# Patient Record
Sex: Female | Born: 1935 | Race: Black or African American | Hispanic: No
Health system: Southern US, Community
[De-identification: ages and names within clinical notes are randomized; demographics above are authoritative.]

---

## 2005-08-18 ENCOUNTER — Ambulatory Visit: Payer: Self-pay | Admitting: Family Medicine

## 2006-09-07 ENCOUNTER — Ambulatory Visit: Payer: Self-pay | Admitting: Family Medicine

## 2007-09-20 ENCOUNTER — Ambulatory Visit: Payer: Self-pay | Admitting: Family Medicine

## 2008-04-03 ENCOUNTER — Ambulatory Visit: Payer: Self-pay | Admitting: Unknown Physician Specialty

## 2008-04-03 ENCOUNTER — Other Ambulatory Visit: Payer: Self-pay

## 2008-04-11 ENCOUNTER — Ambulatory Visit: Payer: Self-pay | Admitting: Unknown Physician Specialty

## 2008-09-25 ENCOUNTER — Ambulatory Visit: Payer: Self-pay | Admitting: Family Medicine

## 2009-10-29 ENCOUNTER — Ambulatory Visit: Payer: Self-pay | Admitting: Family Medicine

## 2011-02-06 ENCOUNTER — Ambulatory Visit: Payer: Self-pay | Admitting: Family Medicine

## 2011-11-12 LAB — CBC
HCT: 28.6 % — ABNORMAL LOW (ref 35.0–47.0)
HGB: 9.5 g/dL — ABNORMAL LOW (ref 12.0–16.0)
MCH: 30 pg (ref 26.0–34.0)
MCHC: 33 g/dL (ref 32.0–36.0)
MCV: 91 fL (ref 80–100)

## 2011-11-12 LAB — TROPONIN I: Troponin-I: <0.02 ng/mL

## 2011-11-12 LAB — PROTIME-INR: Prothrombin Time: 14 secs (ref 11.5–14.7)

## 2011-11-12 LAB — COMPREHENSIVE METABOLIC PANEL
Albumin: 3.4 g/dL (ref 3.4–5.0)
Alkaline Phosphatase: 45 U/L — ABNORMAL LOW (ref 50–136)
Bilirubin,Total: 0.3 mg/dL (ref 0.2–1.0)
Osmolality: 305 (ref 275–301)
Potassium: 3.2 mmol/L — ABNORMAL LOW (ref 3.5–5.1)
SGPT (ALT): 17 U/L
Total Protein: 6.1 g/dL — ABNORMAL LOW (ref 6.4–8.2)

## 2011-11-12 LAB — APTT: Activated PTT: 24.6 secs (ref 23.6–35.9)

## 2011-11-13 ENCOUNTER — Inpatient Hospital Stay: Payer: Self-pay | Admitting: Internal Medicine

## 2011-11-13 LAB — BASIC METABOLIC PANEL
Anion Gap: 10 (ref 7–16)
Chloride: 111 mmol/L — ABNORMAL HIGH (ref 98–107)
Co2: 26 mmol/L (ref 21–32)
Osmolality: 301 (ref 275–301)

## 2011-11-13 LAB — CBC WITH DIFFERENTIAL/PLATELET
Basophil #: 0 10*3/uL (ref 0.0–0.1)
Basophil %: 0.5 %
HCT: 31.3 % — ABNORMAL LOW (ref 35.0–47.0)
HGB: 10.5 g/dL — ABNORMAL LOW (ref 12.0–16.0)
Lymphocyte #: 2.9 10*3/uL (ref 1.0–3.6)
Lymphocyte %: 33.2 %
MCH: 30 pg (ref 26.0–34.0)
MCHC: 33.6 g/dL (ref 32.0–36.0)
MCV: 89 fL (ref 80–100)
Neutrophil #: 5.2 10*3/uL (ref 1.4–6.5)
RDW: 14.3 % (ref 11.5–14.5)

## 2011-11-13 LAB — HEMATOCRIT: HCT: 29.2 % — ABNORMAL LOW (ref 35.0–47.0)

## 2011-11-14 LAB — LIPID PANEL
Cholesterol: 112 mg/dL (ref 0–200)
HDL Cholesterol: 25 mg/dL — ABNORMAL LOW (ref 40–60)
Triglycerides: 132 mg/dL (ref 0–200)
VLDL Cholesterol, Calc: 26 mg/dL (ref 5–40)

## 2011-11-14 LAB — CBC WITH DIFFERENTIAL/PLATELET
Basophil #: 0 10*3/uL (ref 0.0–0.1)
Basophil %: 0.4 %
Eosinophil %: 1.8 %
HCT: 27 % — ABNORMAL LOW (ref 35.0–47.0)
HGB: 9 g/dL — ABNORMAL LOW (ref 12.0–16.0)
Lymphocyte #: 2.5 10*3/uL (ref 1.0–3.6)
Lymphocyte %: 34.5 %
MCH: 29.8 pg (ref 26.0–34.0)
MCHC: 33.2 g/dL (ref 32.0–36.0)
MCV: 90 fL (ref 80–100)
Monocyte #: 0.3 10*3/uL (ref 0.0–0.7)
Monocyte %: 4 %
RDW: 14.2 % (ref 11.5–14.5)
WBC: 7.4 10*3/uL (ref 3.6–11.0)

## 2011-11-14 LAB — BASIC METABOLIC PANEL
Anion Gap: 10 (ref 7–16)
BUN: 23 mg/dL — ABNORMAL HIGH (ref 7–18)
Chloride: 114 mmol/L — ABNORMAL HIGH (ref 98–107)
Co2: 25 mmol/L (ref 21–32)
Osmolality: 300 (ref 275–301)
Potassium: 3.4 mmol/L — ABNORMAL LOW (ref 3.5–5.1)

## 2012-02-11 ENCOUNTER — Ambulatory Visit: Payer: Self-pay | Admitting: Family Medicine

## 2012-06-28 ENCOUNTER — Ambulatory Visit: Payer: Self-pay | Admitting: Gastroenterology

## 2013-04-07 ENCOUNTER — Ambulatory Visit: Payer: Self-pay | Admitting: Family Medicine

## 2014-05-30 ENCOUNTER — Ambulatory Visit: Payer: Self-pay | Admitting: Family Medicine

## 2015-02-04 NOTE — Consult Note (Signed)
Full consult to follow. Multiple bouts of hematemesis last night. Daily bASA plus few days of ibuprofen for bursitis. Hx of GU many yrs ago. Diverticulosis 2003. Hypotensive on admission. BP stable now. No abd pain. Abd nontender. NPO except meds since admission. Plan EGD this afternoon. THanks  Electronic Signatures: Lutricia Feilh, Zacory Fiola (MD)  (Signed on 31-Jan-13 14:50)  Authored  Last Updated: 31-Jan-13 14:50 by Lutricia Feilh, Sharmeka Palmisano (MD)

## 2015-02-04 NOTE — Discharge Summary (Signed)
PATIENT NAME:  [name], Wendy [name] MR#:  [id number] DATE OF BIRTH:  1934-07-04  DATE OF ADMISSION:  11/13/2011 DATE OF DISCHARGE:  11/14/2011  DISCHARGE DIAGNOSES:  1. Hematemesis secondary to erosion at the gastroesophageal junction or Mallory Weiss tear, status post cauterization.  2. Hypertension.   DISCHARGE FOLLOWUP: The patient will followup with her primary doctor, Dr. Illene RegulusSelvidge, in 1 to 2 weeks and also Dr. Bluford Kaufmannh in 1 to 2 weeks.  DISCHARGE MEDICATIONS:  1. Protonix 40 mg p.o. daily.  2. HCTZ/felodipine at home for blood pressure, but she does not remember the dose. She is to call us with the dose so she can continue the same dose of HCTZ/felodipine for her blood pressure.  HOSPITAL COURSE: The patient is a 81 year old female with hypertension and history of peptic ulcer disease with recent bursitis. She was on aspirin and ibuprofen. She came in because of an episode of vomiting of blood. The patient also felt dizzy. She was admitted to ICU for possible GI bleed and started on Protonix drip and IV fluids. The patient was seen by a gastroenterologist and had an EGD done yesterday which showed erosion at the gastroesophageal junction with possible Mallory Weiss tear, and also had cauterization done. Hemoglobin did stay stable. The patient received 2 units of blood transfusion when she came in. Now hemoglobin is 9.5 and hematocrit 28.6. Because she was very dizzy, she received 2 units of blood transfusion. The patient's hemoglobin yesterday was 10.5. It stayed around 9 this morning, with hemoglobin 27. She is feeling better with no further episodes of hematemesis. She was continued on PPIs. The patient will be discharged home with Protonix. We advised her to stop aspirin and ibuprofen for at least to two weeks, according to Gastroenterology recommendations, and see the gastroenterologist in 1 to 2 weeks.       CONDITION ON DISCHARGE: Stable.  TIME SPENT ON DISCHARGE PREPARATION: More than 30  minutes.  ____________________________ Katha HammingSnehalatha Tenzin Edelman, MD sk:slb [name]: 11/14/2011 09:43:51 ET T: 11/15/2011 16:14:55 ET JOB#: [id number]  cc: Katha HammingSnehalatha Retaj Hilbun, MD, <Dictator> Katha HammingSNEHALATHA Desarie Feild MD ELECTRONICALLY SIGNED 12/01/2011 16:19

## 2015-02-04 NOTE — Consult Note (Signed)
Pt intubated for EGD. Either M-W tear or erosion at GE junction. No active bleeding. Area cauterized. Reg diet ordered. Hold ASa/NSAIDS x min 1wk. Continue protonix. Ok for transfer to reg floor if hemodynamically stable. Family not available to talk to. Thanks.  Electronic Signatures: Lutricia Feilh, Christopher Glasscock (MD)  (Signed on 31-Jan-13 16:32)  Authored  Last Updated: 31-Jan-13 16:32 by Lutricia Feilh, Allah Reason (MD)

## 2015-02-04 NOTE — H&P (Signed)
PATIENT NAME:  Wendy [name], Wendy [name] MR#:  [id number] DATE OF BIRTH:  [birth year]  DATE OF ADMISSION:  11/13/2011  PRIMARY CARE PHYSICIAN: [facility]   EMERGENCY ROOM PHYSICIAN: Dr. Glenetta HewMcLaurin   CHIEF COMPLAINT: Hematemesis.   HISTORY: The patient is an 81 year old female who presents with chief complaint of episode of hematemesis. Symptoms began around 6 o'clock. The patient suddenly had an episode where she vomited blood. She then went to the bathroom, fell. She was dizzy after she fell. She denies any injuries. She denies any blood in the stool. She denies any abdominal pain. She denies any syncope. In the Emergency Room, the patient's hemoglobin was noted to be 9.5. The patient had similar history of upper GI bleeding many years back, she feels maybe about 30 years. She had an endoscopy done and she was noted to have a peptic ulcer. One year ago she had a colonoscopy which was negative.   PAST MEDICAL HISTORY:  1. Peptic ulcer disease. 2. Hypertension.   ALLERGIES: No known drug allergies.   CURRENT MEDICATIONS:  1. Simvastatin, dose unknown. 2. Hydrochlorothiazide. 3. Felodipine daily.  4. Aspirin 81 mg p.o. daily.   SOCIAL HISTORY: The patient denies tobacco abuse, alcohol abuse, or drug abuse.   FAMILY HISTORY: The patient's mother died at 79 years old, had an MI. Father died, had bone cancer.   REVIEW OF SYSTEMS: CONSTITUTIONAL: The patient denies any fevers, chills, night sweats. HEENT: The patient denies any hearing loss, dysphagia, visual problems, sore throat. CARDIOVASCULAR: The patient denies any chest pain, orthopnea, or PND. RESPIRATORY: The patient denies any cough, wheezing, or hemoptysis. GI: The patient denies any abdominal pain, hematemesis, hematochezia, or melena. GU: The patient denies any hematuria, dysuria, or frequency. NEUROLOGIC: The patient denies any headache, focal weakness, or seizures. SKIN: The patient denies any lesions or rash. ENDOCRINE: The patient denies  polyuria, polyphagia, or polydipsia. MUSCULOSKELETAL: The patient denies any arthralgias, myalgias, joint swelling, tenderness. HEMATOLOGIC: The patient denies any easy bleeding or bruises.    PHYSICAL EXAMINATION:  VITAL SIGNS: Temperature 99.3, heart rate 90, respiratory rate 18, blood pressure 186/50, oxygen sat 97%.   HEENT: Atraumatic, normocephalic. Pupils equal, round, and reactive to light and accommodation. Extraocular movements intact. Sclerae anicteric. Mucous membranes moist.   NECK: Supple. No organomegaly.   CARDIOVASCULAR: S1, S2, regular rate, rhythm. No gallops. No thrills. No murmurs.   RESPIRATORY: Lungs are clear to auscultation. No rales, no rhonchi, no wheezes, no bronchial breath sounds.   GI: Abdomen is soft, nontender, nondistended. Normal bowel sounds. No hepatosplenomegaly.   GU: There is no hematuria or masses noted.   SKIN: No lesions, no rash.   ENDOCRINE: No masses, no thyromegaly.   LYMPH: No lymphadenopathy or nodes palpable.   NEUROLOGIC: Cranial nerves II through XII grossly intact. Motor strength is 5 out of 5 in bilateral upper and lower extremities. Sensation within normal limits. No focal neurological deficits noted on examination.   MUSCULOSKELETAL: No arthritis, joint effusion, or swelling.   HEMATOLOGICAL: No ecchymosis, no bleeding, no petechiae.   EXTREMITIES: No cyanosis, no clubbing, no edema. 2+ pedal pulses noted bilaterally.   LABORATORY, DIAGNOSTIC, AND RADIOLOGICAL DATA: Electrocardiogram sinus tachycardia, nonspecific T wave abnormalities, 112 beats per minute.   Glucose 111, BUN 45, creatinine 0.96, sodium 147, potassium 3.2, chloride 107, total bilirubin 0.3, alkaline phosphatase 45, ALT 17, AST 16, total protein 6.1, albumin 3.4, estimated GFR greater than 60, WBC count 9400, hemoglobin 9.5, hematocrit 28.6, platelet count 239, MCV 91,  PT 14, INR 1, APTT 24.6. Troponin less than 0.02.   ASSESSMENT AND PLAN:  1. The patient is  a 81 year old female who presents with chief complaint of hematemesis, acute upper GI bleeding, hypotensive. Admit to CCU. Start IV Protonix drip. Hemoglobin and hematocrit q.6 hours. IV fluids. Gastroenterology consultation. Dr. Marva Panda has been notified by the Emergency Room. 2. Anemia. Blood transfusion. Monitor hemoglobin closely.  3. Hypernatremia. Monitor sodium levels closely. Will likely improve with IV hydration.  4. Hypokalemia. Replace potassium. Recheck in the morning.  5. Hypertension. Hold antihypertensive medications for now due to GI bleeding.   ____________________________ Donia Ast, MD jsp:drc [name]: 11/13/2011 00:31:13 ET T: 11/13/2011 07:02:18 ET JOB#: [id number]  cc: Donia Ast, MD, <Dictator> [facility] Donia Ast MD ELECTRONICALLY SIGNED 11/13/2011 8:37

## 2015-02-04 NOTE — Consult Note (Signed)
PATIENT NAME:  Wendy [name], Wendy [name] MR#:  [id number] DATE OF BIRTH:  Jan 22, 1934  DATE OF CONSULTATION:  11/13/2011  REFERRING PHYSICIAN:   CONSULTING PHYSICIAN:  Ezzard StandingPaul Y. Bluford Kaufmannh, MD  REASON FOR REFERRAL: Hematemesis.   HISTORY OF PRESENT ILLNESS: Wendy [name] is a 81 year old black female who came in to the Emergency Room with multiple bouts of hematemesis overnight. She describes as having rather bright blood per mouth. She went to the bathroom and fell and felt dizzy after she fell. In the Emergency Room, her hemoglobin was 9.5 and she was hypotensive. As a result, the patient was brought in. She apparently had at least 5 to 6 episodes of vomiting.   She recalls having a similar episode over 30 years ago where she had an endoscopy done and was told that she had some gastric ulcers. She also says it's been a few years since she had a colonoscopy done which was negative. In our records she had a colonoscopy Dr. Maryruth BunKapur in 2003 which showed diverticulosis. There is no mention of a subsequent colonoscopy.   Since being admitted, she feels much better. She does not feel nauseous. She denies any abdominal pain, heartburn, or indigestion. She does admit to taking baby aspirin daily for years. Because of right shoulder bursitis, she took Advil for two days prior to her getting sick.   PAST MEDICAL HISTORY:  1. Hypertension.  2. Ulcer disease.   ALLERGIES: She has no known drug allergies.   MEDICATIONS AT HOME:  1. Zocor. 2. Hydrochlorothiazide. 3. Baby aspirin. 4. Felodipine. 5. Ibuprofen.   SOCIAL HISTORY, FAMILY HISTORY, REVIEW OF SYSTEMS: Please refer to the initial history and physical that was done last night by [facility]. There has not been any changes. The other change is that she has not had any further bleeding/vomiting since admission. She denies any gross hematochezia or melena.   PHYSICAL EXAMINATION:   GENERAL: She is in no acute distress right now.   VITAL SIGNS: Currently her blood  pressure when I examined her was systolic blood pressure 114, had gone down to 79 although it is unclear whether this is accurate or not.   HEENT: Normocephalic, atraumatic head. Pupils equally clear. No evidence of icteric sclerae.   NECK: Supple.   CARDIAC: Regular rhythm and rate without any murmurs.   LUNGS: Clear bilaterally.   ABDOMEN: Normoactive bowel sounds, soft, nontender throughout. There is no hepatomegaly. There are no palpable masses. She had active bowel sounds. It is nontender throughout.   EXTREMITIES: No clubbing, cyanosis, or edema.   NEUROLOGIC: She is alert and oriented.   SKIN: Normal in color.   LABORATORY, DIAGNOSTIC, AND RADIOLOGICAL DATA: Initial hemoglobin was 9.5, is 9.8 this afternoon. Liver enzymes are normal. Sodium 147 from this morning, potassium is up to 3.5 from 3.2, chloride 111, CO2 26, BUN 35, creatinine 1.13, glucose 105. INR is normal.  ASSESSMENT AND PLAN: This is a patient with hematemesis last night. It has stopped. She is on Protonix drip. She was given some additional potassium. With aspirin and ibuprofen use, she could have recurrent ulcer disease. She could also have Mallory-Weiss tear as well. The patient has been n.p.o. since admission. She is a lot more stable now than on admission. I discussed scheduling an upper endoscopy with her to evaluate the upper GI tract. The patient and the family want to get this done today. We will schedule her for this afternoon. In the meantime, continue to monitor hemoglobin and continue IV fluids as  well as Protonix. If her hemoglobin falls significantly, she will need blood transfusion. Hopefully, if there is no significant bleeding the patient may be able to eat after the procedure.   Thank you for the referral.   ____________________________ Ezzard Standing. Bluford Kaufmann, MD pyo:drc [name]: 11/13/2011 15:38:59 ET T: 11/14/2011 10:38:32 ET JOB#: [id number]  cc: Ezzard Standing. Bluford Kaufmann, MD, <Dictator> Ezzard Standing Leiam Hopwood MD ELECTRONICALLY SIGNED  11/17/2011 12:06

## 2016-01-09 ENCOUNTER — Other Ambulatory Visit: Payer: Self-pay | Admitting: Family Medicine

## 2016-01-09 DIAGNOSIS — Z1231 Encounter for screening mammogram for malignant neoplasm of breast: Secondary | ICD-10-CM

## 2016-01-21 ENCOUNTER — Ambulatory Visit
Admission: RE | Admit: 2016-01-21 | Discharge: 2016-01-21 | Disposition: A | Discharge status: Home / Self Care | Payer: Medicare Other | Source: Ambulatory Visit | Attending: Family Medicine | Admitting: Family Medicine

## 2016-01-21 DIAGNOSIS — Z1231 Encounter for screening mammogram for malignant neoplasm of breast: Secondary | ICD-10-CM | POA: Diagnosis not present

## 2016-12-12 ENCOUNTER — Other Ambulatory Visit: Payer: Self-pay | Admitting: Family Medicine

## 2016-12-12 DIAGNOSIS — Z1231 Encounter for screening mammogram for malignant neoplasm of breast: Secondary | ICD-10-CM

## 2017-01-21 ENCOUNTER — Other Ambulatory Visit: Payer: Self-pay | Admitting: Family Medicine

## 2017-01-21 ENCOUNTER — Ambulatory Visit
Admission: RE | Admit: 2017-01-21 | Discharge: 2017-01-21 | Disposition: A | Discharge status: Home / Self Care | Payer: Medicare Other | Source: Ambulatory Visit | Attending: Family Medicine | Admitting: Family Medicine

## 2017-01-21 DIAGNOSIS — Z1231 Encounter for screening mammogram for malignant neoplasm of breast: Secondary | ICD-10-CM | POA: Diagnosis not present

## 2018-02-01 ENCOUNTER — Other Ambulatory Visit: Payer: Self-pay | Admitting: Family Medicine

## 2018-02-01 DIAGNOSIS — Z1231 Encounter for screening mammogram for malignant neoplasm of breast: Secondary | ICD-10-CM

## 2018-02-10 ENCOUNTER — Ambulatory Visit
Admission: RE | Admit: 2018-02-10 | Discharge: 2018-02-10 | Disposition: A | Discharge status: Home / Self Care | Payer: Medicare Other | Source: Ambulatory Visit | Attending: Family Medicine | Admitting: Family Medicine

## 2018-02-10 DIAGNOSIS — Z1231 Encounter for screening mammogram for malignant neoplasm of breast: Secondary | ICD-10-CM | POA: Diagnosis present

## 2019-03-29 ENCOUNTER — Other Ambulatory Visit: Payer: Self-pay | Admitting: Family Medicine

## 2019-03-29 DIAGNOSIS — Z1231 Encounter for screening mammogram for malignant neoplasm of breast: Secondary | ICD-10-CM

## 2019-04-07 ENCOUNTER — Ambulatory Visit
Admission: RE | Admit: 2019-04-07 | Discharge: 2019-04-07 | Disposition: A | Discharge status: Home / Self Care | Payer: Medicare Other | Source: Ambulatory Visit | Attending: Family Medicine | Admitting: Family Medicine

## 2019-04-07 ENCOUNTER — Other Ambulatory Visit: Payer: Self-pay

## 2019-04-07 DIAGNOSIS — Z1231 Encounter for screening mammogram for malignant neoplasm of breast: Secondary | ICD-10-CM

## 2019-11-08 ENCOUNTER — Other Ambulatory Visit: Payer: Self-pay | Admitting: Student

## 2019-11-08 DIAGNOSIS — Z13820 Encounter for screening for osteoporosis: Secondary | ICD-10-CM

## 2019-11-08 DIAGNOSIS — Z780 Asymptomatic menopausal state: Secondary | ICD-10-CM

## 2019-11-17 ENCOUNTER — Encounter (INDEPENDENT_AMBULATORY_CARE_PROVIDER_SITE_OTHER): Payer: Self-pay

## 2019-11-17 ENCOUNTER — Other Ambulatory Visit: Payer: Self-pay

## 2019-11-17 ENCOUNTER — Ambulatory Visit
Admission: RE | Admit: 2019-11-17 | Discharge: 2019-11-17 | Disposition: A | Discharge status: Home / Self Care | Payer: Medicare HMO | Source: Ambulatory Visit | Attending: Obstetrics and Gynecology | Admitting: Obstetrics and Gynecology

## 2019-11-17 DIAGNOSIS — Z13820 Encounter for screening for osteoporosis: Secondary | ICD-10-CM | POA: Insufficient documentation

## 2019-11-17 DIAGNOSIS — Z780 Asymptomatic menopausal state: Secondary | ICD-10-CM | POA: Diagnosis present

## 2020-11-12 ENCOUNTER — Other Ambulatory Visit: Payer: Self-pay | Admitting: Student

## 2020-11-12 DIAGNOSIS — M25511 Pain in right shoulder: Secondary | ICD-10-CM

## 2021-07-08 DIAGNOSIS — Z0000 Encounter for general adult medical examination without abnormal findings: Secondary | ICD-10-CM | POA: Diagnosis not present

## 2021-07-08 DIAGNOSIS — M25561 Pain in right knee: Secondary | ICD-10-CM | POA: Diagnosis not present

## 2021-07-09 ENCOUNTER — Ambulatory Visit
Admission: RE | Admit: 2021-07-09 | Discharge: 2021-07-09 | Disposition: A | Discharge status: Home / Self Care | Payer: Medicare HMO | Attending: Student | Admitting: Student

## 2021-07-09 ENCOUNTER — Other Ambulatory Visit: Payer: Self-pay | Admitting: Student

## 2021-07-09 ENCOUNTER — Ambulatory Visit
Admission: RE | Admit: 2021-07-09 | Discharge: 2021-07-09 | Disposition: A | Discharge status: Home / Self Care | Payer: Medicare HMO | Source: Ambulatory Visit | Attending: Student | Admitting: Student

## 2021-07-09 DIAGNOSIS — M1711 Unilateral primary osteoarthritis, right knee: Secondary | ICD-10-CM | POA: Insufficient documentation

## 2021-07-09 DIAGNOSIS — M25561 Pain in right knee: Secondary | ICD-10-CM | POA: Diagnosis not present

## 2021-08-21 DIAGNOSIS — Z23 Encounter for immunization: Secondary | ICD-10-CM | POA: Diagnosis not present

## 2021-08-21 DIAGNOSIS — Z0000 Encounter for general adult medical examination without abnormal findings: Secondary | ICD-10-CM | POA: Diagnosis not present

## 2021-10-01 DIAGNOSIS — I10 Essential (primary) hypertension: Secondary | ICD-10-CM | POA: Diagnosis not present

## 2021-10-01 DIAGNOSIS — Z23 Encounter for immunization: Secondary | ICD-10-CM | POA: Diagnosis not present

## 2021-10-01 DIAGNOSIS — M129 Arthropathy, unspecified: Secondary | ICD-10-CM | POA: Diagnosis not present

## 2021-10-01 DIAGNOSIS — N183 Chronic kidney disease, stage 3 unspecified: Secondary | ICD-10-CM | POA: Diagnosis not present

## 2021-10-01 DIAGNOSIS — E785 Hyperlipidemia, unspecified: Secondary | ICD-10-CM | POA: Diagnosis not present

## 2021-10-01 DIAGNOSIS — Z0000 Encounter for general adult medical examination without abnormal findings: Secondary | ICD-10-CM | POA: Diagnosis not present

## 2021-10-01 DIAGNOSIS — Z0130 Encounter for examination of blood pressure without abnormal findings: Secondary | ICD-10-CM | POA: Diagnosis not present

## 2021-11-22 DIAGNOSIS — K5900 Constipation, unspecified: Secondary | ICD-10-CM | POA: Diagnosis not present

## 2021-11-22 DIAGNOSIS — Z8249 Family history of ischemic heart disease and other diseases of the circulatory system: Secondary | ICD-10-CM | POA: Diagnosis not present

## 2021-11-22 DIAGNOSIS — Z809 Family history of malignant neoplasm, unspecified: Secondary | ICD-10-CM | POA: Diagnosis not present

## 2021-11-22 DIAGNOSIS — I10 Essential (primary) hypertension: Secondary | ICD-10-CM | POA: Diagnosis not present

## 2021-11-22 DIAGNOSIS — M1990 Unspecified osteoarthritis, unspecified site: Secondary | ICD-10-CM | POA: Diagnosis not present

## 2021-11-22 DIAGNOSIS — E785 Hyperlipidemia, unspecified: Secondary | ICD-10-CM | POA: Diagnosis not present

## 2021-11-22 DIAGNOSIS — K219 Gastro-esophageal reflux disease without esophagitis: Secondary | ICD-10-CM | POA: Diagnosis not present

## 2021-12-23 ENCOUNTER — Ambulatory Visit: Payer: Self-pay

## 2021-12-23 DIAGNOSIS — R42 Dizziness and giddiness: Secondary | ICD-10-CM | POA: Diagnosis not present

## 2021-12-23 DIAGNOSIS — I10 Essential (primary) hypertension: Secondary | ICD-10-CM | POA: Diagnosis not present

## 2021-12-23 DIAGNOSIS — Z79899 Other long term (current) drug therapy: Secondary | ICD-10-CM | POA: Diagnosis not present

## 2021-12-23 DIAGNOSIS — E785 Hyperlipidemia, unspecified: Secondary | ICD-10-CM | POA: Diagnosis not present

## 2021-12-23 DIAGNOSIS — H8110 Benign paroxysmal vertigo, unspecified ear: Secondary | ICD-10-CM | POA: Diagnosis not present

## 2021-12-23 DIAGNOSIS — Z7982 Long term (current) use of aspirin: Secondary | ICD-10-CM | POA: Diagnosis not present

## 2021-12-24 DIAGNOSIS — H8110 Benign paroxysmal vertigo, unspecified ear: Secondary | ICD-10-CM | POA: Diagnosis not present

## 2022-01-21 DIAGNOSIS — J301 Allergic rhinitis due to pollen: Secondary | ICD-10-CM | POA: Diagnosis not present

## 2022-01-21 DIAGNOSIS — Z0130 Encounter for examination of blood pressure without abnormal findings: Secondary | ICD-10-CM | POA: Diagnosis not present

## 2022-01-21 DIAGNOSIS — Z0000 Encounter for general adult medical examination without abnormal findings: Secondary | ICD-10-CM | POA: Diagnosis not present

## 2022-01-21 DIAGNOSIS — R42 Dizziness and giddiness: Secondary | ICD-10-CM | POA: Diagnosis not present

## 2022-02-05 DIAGNOSIS — I10 Essential (primary) hypertension: Secondary | ICD-10-CM | POA: Diagnosis not present

## 2022-02-05 DIAGNOSIS — Z7982 Long term (current) use of aspirin: Secondary | ICD-10-CM | POA: Diagnosis not present

## 2022-02-05 DIAGNOSIS — Z79899 Other long term (current) drug therapy: Secondary | ICD-10-CM | POA: Diagnosis not present

## 2022-02-05 DIAGNOSIS — H903 Sensorineural hearing loss, bilateral: Secondary | ICD-10-CM | POA: Diagnosis not present

## 2022-02-05 DIAGNOSIS — E785 Hyperlipidemia, unspecified: Secondary | ICD-10-CM | POA: Diagnosis not present

## 2022-02-05 DIAGNOSIS — H8110 Benign paroxysmal vertigo, unspecified ear: Secondary | ICD-10-CM | POA: Diagnosis not present

## 2022-02-05 DIAGNOSIS — H6121 Impacted cerumen, right ear: Secondary | ICD-10-CM | POA: Diagnosis not present

## 2022-02-20 DIAGNOSIS — M13861 Other specified arthritis, right knee: Secondary | ICD-10-CM | POA: Diagnosis not present

## 2022-04-23 DIAGNOSIS — Z0131 Encounter for examination of blood pressure with abnormal findings: Secondary | ICD-10-CM | POA: Diagnosis not present

## 2022-04-23 DIAGNOSIS — Z1389 Encounter for screening for other disorder: Secondary | ICD-10-CM | POA: Diagnosis not present

## 2022-04-23 DIAGNOSIS — N183 Chronic kidney disease, stage 3 unspecified: Secondary | ICD-10-CM | POA: Diagnosis not present

## 2022-04-23 DIAGNOSIS — Z0000 Encounter for general adult medical examination without abnormal findings: Secondary | ICD-10-CM | POA: Diagnosis not present

## 2022-04-23 DIAGNOSIS — I10 Essential (primary) hypertension: Secondary | ICD-10-CM | POA: Diagnosis not present

## 2022-04-23 DIAGNOSIS — Z0130 Encounter for examination of blood pressure without abnormal findings: Secondary | ICD-10-CM | POA: Diagnosis not present

## 2022-05-14 DIAGNOSIS — Z1331 Encounter for screening for depression: Secondary | ICD-10-CM | POA: Diagnosis not present

## 2022-05-14 DIAGNOSIS — Z0131 Encounter for examination of blood pressure with abnormal findings: Secondary | ICD-10-CM | POA: Diagnosis not present

## 2022-05-14 DIAGNOSIS — Z1389 Encounter for screening for other disorder: Secondary | ICD-10-CM | POA: Diagnosis not present

## 2022-05-14 DIAGNOSIS — H8110 Benign paroxysmal vertigo, unspecified ear: Secondary | ICD-10-CM | POA: Diagnosis not present

## 2022-05-14 DIAGNOSIS — Z0130 Encounter for examination of blood pressure without abnormal findings: Secondary | ICD-10-CM | POA: Diagnosis not present

## 2022-05-17 IMAGING — CR DG KNEE COMPLETE 4+V*R*
4 series · 4 of 4 positions shown · non-contrast
Comparison: None.

CLINICAL DATA: Right knee pain

EXAM:
RIGHT KNEE - COMPLETE 4+ VIEW

[knee ap]
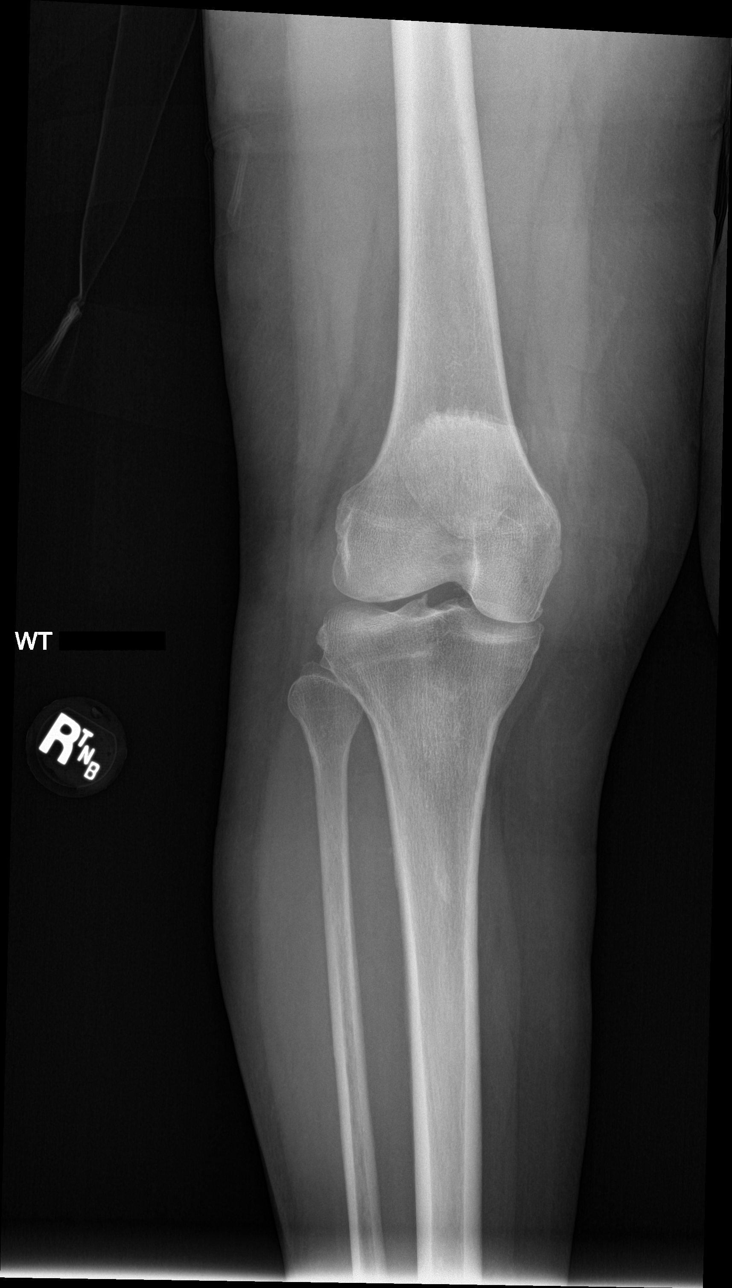

[knee lat]
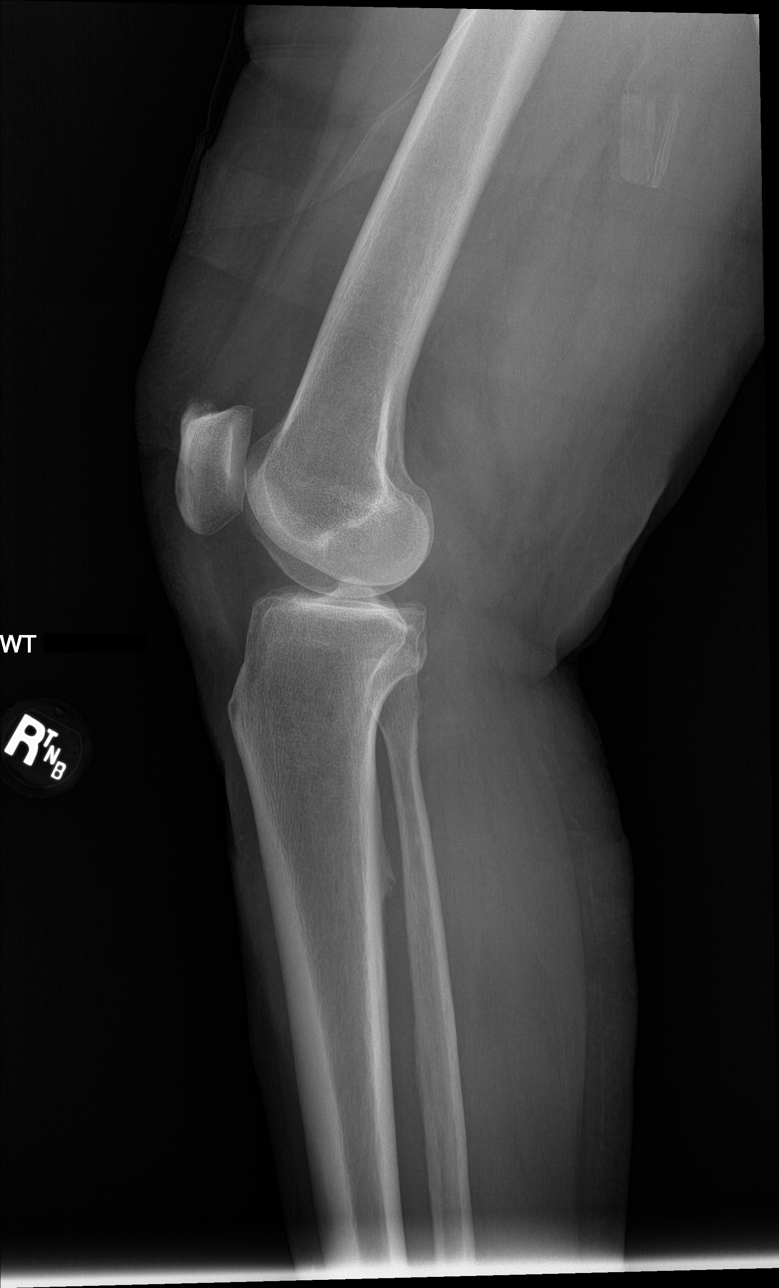

[tunnel]
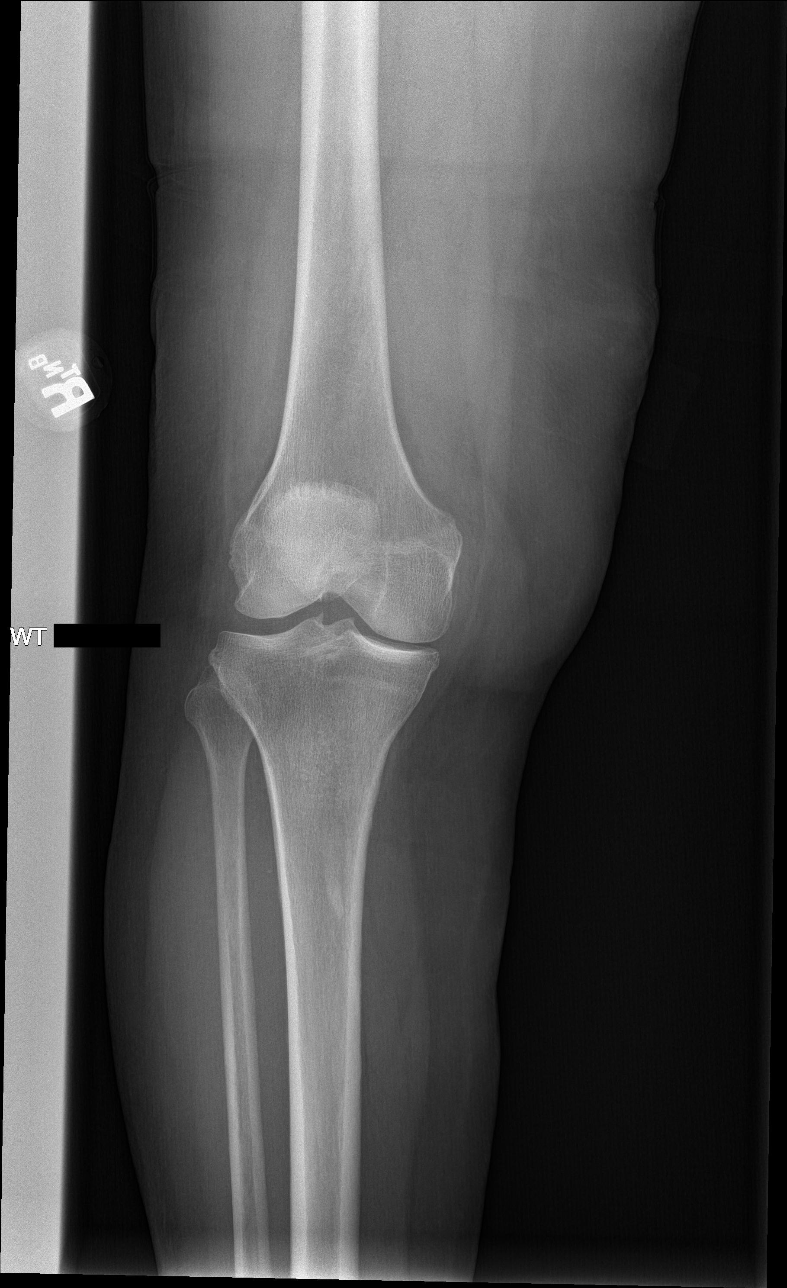

[patella skyline]
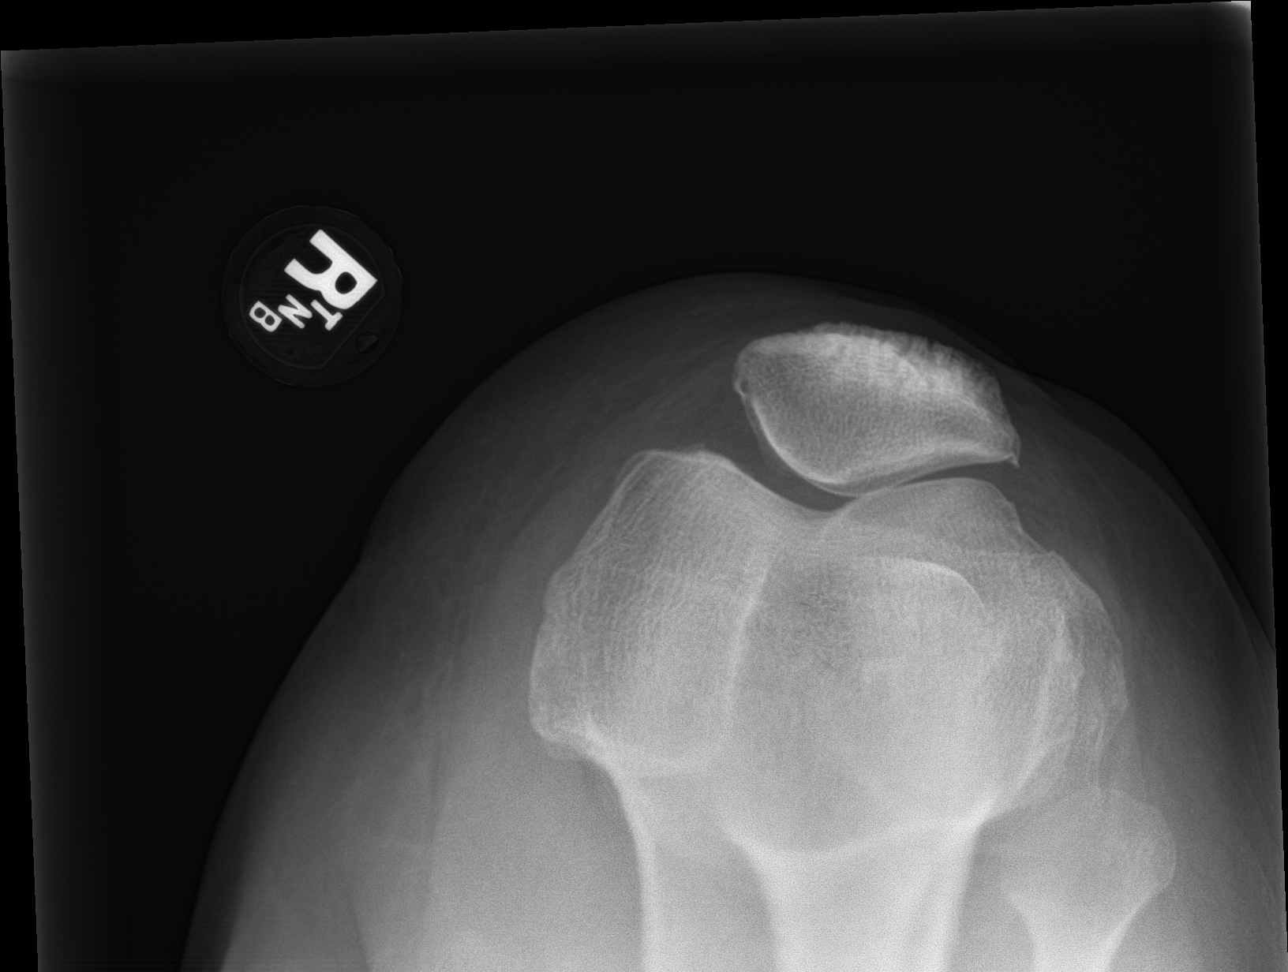

[4 of 4 positions shown; findings below may reference images not displayed]

FINDINGS: There is no acute fracture or dislocation. Knee alignment is normal.
There is moderate medial and mild lateral tibiofemoral joint space
narrowing and mild lateral patellofemoral joint space narrowing with
slight lateral patellar tilt. There is mild associated subchondral
sclerosis and osteophytosis. There is mild superior patellar
enthesopathy. There is no significant effusion. The soft tissues are
unremarkable.
IMPRESSION: Mild degenerative changes changes about the knee as above, primarily
affecting the medial compartment.

## 2022-06-10 DIAGNOSIS — Z0131 Encounter for examination of blood pressure with abnormal findings: Secondary | ICD-10-CM | POA: Diagnosis not present

## 2022-06-10 DIAGNOSIS — Z0000 Encounter for general adult medical examination without abnormal findings: Secondary | ICD-10-CM | POA: Diagnosis not present

## 2022-06-10 DIAGNOSIS — Z1389 Encounter for screening for other disorder: Secondary | ICD-10-CM | POA: Diagnosis not present

## 2022-06-10 DIAGNOSIS — Z0130 Encounter for examination of blood pressure without abnormal findings: Secondary | ICD-10-CM | POA: Diagnosis not present

## 2022-08-12 DIAGNOSIS — Z23 Encounter for immunization: Secondary | ICD-10-CM | POA: Diagnosis not present

## 2022-10-14 DIAGNOSIS — Z1389 Encounter for screening for other disorder: Secondary | ICD-10-CM | POA: Diagnosis not present

## 2022-10-14 DIAGNOSIS — N183 Chronic kidney disease, stage 3 unspecified: Secondary | ICD-10-CM | POA: Diagnosis not present

## 2022-10-14 DIAGNOSIS — Z0000 Encounter for general adult medical examination without abnormal findings: Secondary | ICD-10-CM | POA: Diagnosis not present

## 2022-10-14 DIAGNOSIS — Z0130 Encounter for examination of blood pressure without abnormal findings: Secondary | ICD-10-CM | POA: Diagnosis not present

## 2022-10-14 DIAGNOSIS — J302 Other seasonal allergic rhinitis: Secondary | ICD-10-CM | POA: Diagnosis not present

## 2022-10-14 DIAGNOSIS — R739 Hyperglycemia, unspecified: Secondary | ICD-10-CM | POA: Diagnosis not present

## 2022-10-14 DIAGNOSIS — Z0131 Encounter for examination of blood pressure with abnormal findings: Secondary | ICD-10-CM | POA: Diagnosis not present

## 2022-10-14 DIAGNOSIS — H6091 Unspecified otitis externa, right ear: Secondary | ICD-10-CM | POA: Diagnosis not present

## 2022-10-14 DIAGNOSIS — I10 Essential (primary) hypertension: Secondary | ICD-10-CM | POA: Diagnosis not present

## 2022-11-08 DIAGNOSIS — Z7982 Long term (current) use of aspirin: Secondary | ICD-10-CM | POA: Diagnosis not present

## 2022-11-08 DIAGNOSIS — I10 Essential (primary) hypertension: Secondary | ICD-10-CM | POA: Diagnosis not present

## 2022-11-08 DIAGNOSIS — E785 Hyperlipidemia, unspecified: Secondary | ICD-10-CM | POA: Diagnosis not present

## 2022-11-08 DIAGNOSIS — R519 Headache, unspecified: Secondary | ICD-10-CM | POA: Diagnosis not present

## 2022-11-08 DIAGNOSIS — H6121 Impacted cerumen, right ear: Secondary | ICD-10-CM | POA: Diagnosis not present

## 2022-11-08 DIAGNOSIS — H9201 Otalgia, right ear: Secondary | ICD-10-CM | POA: Diagnosis not present

## 2022-11-25 DIAGNOSIS — Z0130 Encounter for examination of blood pressure without abnormal findings: Secondary | ICD-10-CM | POA: Diagnosis not present

## 2022-11-25 DIAGNOSIS — Z0131 Encounter for examination of blood pressure with abnormal findings: Secondary | ICD-10-CM | POA: Diagnosis not present

## 2022-11-25 DIAGNOSIS — I10 Essential (primary) hypertension: Secondary | ICD-10-CM | POA: Diagnosis not present

## 2022-11-25 DIAGNOSIS — H6121 Impacted cerumen, right ear: Secondary | ICD-10-CM | POA: Diagnosis not present

## 2022-11-25 DIAGNOSIS — R519 Headache, unspecified: Secondary | ICD-10-CM | POA: Diagnosis not present

## 2022-11-25 DIAGNOSIS — Z1389 Encounter for screening for other disorder: Secondary | ICD-10-CM | POA: Diagnosis not present

## 2023-01-08 DIAGNOSIS — Z0131 Encounter for examination of blood pressure with abnormal findings: Secondary | ICD-10-CM | POA: Diagnosis not present

## 2023-01-08 DIAGNOSIS — Z23 Encounter for immunization: Secondary | ICD-10-CM | POA: Diagnosis not present

## 2023-01-08 DIAGNOSIS — R519 Headache, unspecified: Secondary | ICD-10-CM | POA: Diagnosis not present

## 2023-01-08 DIAGNOSIS — Z1389 Encounter for screening for other disorder: Secondary | ICD-10-CM | POA: Diagnosis not present

## 2023-01-08 DIAGNOSIS — H8110 Benign paroxysmal vertigo, unspecified ear: Secondary | ICD-10-CM | POA: Diagnosis not present

## 2023-01-08 DIAGNOSIS — I10 Essential (primary) hypertension: Secondary | ICD-10-CM | POA: Diagnosis not present

## 2023-01-08 DIAGNOSIS — Z0130 Encounter for examination of blood pressure without abnormal findings: Secondary | ICD-10-CM | POA: Diagnosis not present

## 2023-02-09 DIAGNOSIS — I10 Essential (primary) hypertension: Secondary | ICD-10-CM | POA: Diagnosis not present

## 2023-08-13 DIAGNOSIS — Z1389 Encounter for screening for other disorder: Secondary | ICD-10-CM | POA: Diagnosis not present

## 2023-08-13 DIAGNOSIS — J302 Other seasonal allergic rhinitis: Secondary | ICD-10-CM | POA: Diagnosis not present

## 2023-08-13 DIAGNOSIS — Z0130 Encounter for examination of blood pressure without abnormal findings: Secondary | ICD-10-CM | POA: Diagnosis not present

## 2023-08-13 DIAGNOSIS — N183 Chronic kidney disease, stage 3 unspecified: Secondary | ICD-10-CM | POA: Diagnosis not present

## 2023-08-13 DIAGNOSIS — Z0131 Encounter for examination of blood pressure with abnormal findings: Secondary | ICD-10-CM | POA: Diagnosis not present

## 2023-08-13 DIAGNOSIS — Z23 Encounter for immunization: Secondary | ICD-10-CM | POA: Diagnosis not present

## 2023-08-13 DIAGNOSIS — I10 Essential (primary) hypertension: Secondary | ICD-10-CM | POA: Diagnosis not present

## 2023-08-13 DIAGNOSIS — Z0000 Encounter for general adult medical examination without abnormal findings: Secondary | ICD-10-CM | POA: Diagnosis not present

## 2023-09-09 DIAGNOSIS — I10 Essential (primary) hypertension: Secondary | ICD-10-CM | POA: Diagnosis not present

## 2023-09-09 DIAGNOSIS — N183 Chronic kidney disease, stage 3 unspecified: Secondary | ICD-10-CM | POA: Diagnosis not present

## 2023-09-09 DIAGNOSIS — E876 Hypokalemia: Secondary | ICD-10-CM | POA: Diagnosis not present

## 2024-01-06 DIAGNOSIS — N183 Chronic kidney disease, stage 3 unspecified: Secondary | ICD-10-CM | POA: Diagnosis not present

## 2024-01-06 DIAGNOSIS — I10 Essential (primary) hypertension: Secondary | ICD-10-CM | POA: Diagnosis not present

## 2024-04-04 DIAGNOSIS — H524 Presbyopia: Secondary | ICD-10-CM | POA: Diagnosis not present

## 2024-04-04 DIAGNOSIS — Z0100 Encounter for examination of eyes and vision without abnormal findings: Secondary | ICD-10-CM | POA: Diagnosis not present

## 2024-04-12 DIAGNOSIS — M1990 Unspecified osteoarthritis, unspecified site: Secondary | ICD-10-CM | POA: Diagnosis not present

## 2024-04-12 DIAGNOSIS — Z823 Family history of stroke: Secondary | ICD-10-CM | POA: Diagnosis not present

## 2024-04-12 DIAGNOSIS — Z809 Family history of malignant neoplasm, unspecified: Secondary | ICD-10-CM | POA: Diagnosis not present

## 2024-04-12 DIAGNOSIS — E785 Hyperlipidemia, unspecified: Secondary | ICD-10-CM | POA: Diagnosis not present

## 2024-04-12 DIAGNOSIS — J301 Allergic rhinitis due to pollen: Secondary | ICD-10-CM | POA: Diagnosis not present

## 2024-04-12 DIAGNOSIS — R739 Hyperglycemia, unspecified: Secondary | ICD-10-CM | POA: Diagnosis not present

## 2024-04-12 DIAGNOSIS — Z8249 Family history of ischemic heart disease and other diseases of the circulatory system: Secondary | ICD-10-CM | POA: Diagnosis not present

## 2024-04-12 DIAGNOSIS — K219 Gastro-esophageal reflux disease without esophagitis: Secondary | ICD-10-CM | POA: Diagnosis not present

## 2024-04-12 DIAGNOSIS — N1831 Chronic kidney disease, stage 3a: Secondary | ICD-10-CM | POA: Diagnosis not present

## 2024-04-12 DIAGNOSIS — Z008 Encounter for other general examination: Secondary | ICD-10-CM | POA: Diagnosis not present

## 2024-04-12 DIAGNOSIS — I129 Hypertensive chronic kidney disease with stage 1 through stage 4 chronic kidney disease, or unspecified chronic kidney disease: Secondary | ICD-10-CM | POA: Diagnosis not present

## 2024-06-20 DIAGNOSIS — Z0000 Encounter for general adult medical examination without abnormal findings: Secondary | ICD-10-CM | POA: Diagnosis not present

## 2024-06-20 DIAGNOSIS — R519 Headache, unspecified: Secondary | ICD-10-CM | POA: Diagnosis not present

## 2024-06-20 DIAGNOSIS — R7303 Prediabetes: Secondary | ICD-10-CM | POA: Diagnosis not present

## 2024-06-20 DIAGNOSIS — I10 Essential (primary) hypertension: Secondary | ICD-10-CM | POA: Diagnosis not present

## 2024-06-20 DIAGNOSIS — N183 Chronic kidney disease, stage 3 unspecified: Secondary | ICD-10-CM | POA: Diagnosis not present

## 2024-06-20 DIAGNOSIS — Z0131 Encounter for examination of blood pressure with abnormal findings: Secondary | ICD-10-CM | POA: Diagnosis not present

## 2024-06-20 DIAGNOSIS — Z0130 Encounter for examination of blood pressure without abnormal findings: Secondary | ICD-10-CM | POA: Diagnosis not present

## 2024-06-20 DIAGNOSIS — E785 Hyperlipidemia, unspecified: Secondary | ICD-10-CM | POA: Diagnosis not present

## 2024-06-20 DIAGNOSIS — Z1389 Encounter for screening for other disorder: Secondary | ICD-10-CM | POA: Diagnosis not present

## 2024-06-20 DIAGNOSIS — G250 Essential tremor: Secondary | ICD-10-CM | POA: Diagnosis not present
